# Patient Record
Sex: Female | Born: 1965 | Race: Black or African American | Hispanic: No | Marital: Single | Smoking: Current every day smoker
Health system: Southern US, Community
[De-identification: ages and names within clinical notes are randomized; demographics above are authoritative.]

## PROBLEM LIST (undated history)

## (undated) DIAGNOSIS — D649 Anemia, unspecified: Secondary | ICD-10-CM

## (undated) DIAGNOSIS — D219 Benign neoplasm of connective and other soft tissue, unspecified: Secondary | ICD-10-CM

## (undated) DIAGNOSIS — M1990 Unspecified osteoarthritis, unspecified site: Secondary | ICD-10-CM

## (undated) DIAGNOSIS — M810 Age-related osteoporosis without current pathological fracture: Secondary | ICD-10-CM

## (undated) HISTORY — DX: Anemia, unspecified: D64.9

## (undated) HISTORY — DX: Age-related osteoporosis without current pathological fracture: M81.0

## (undated) HISTORY — DX: Unspecified osteoarthritis, unspecified site: M19.90

---

## 1991-01-15 HISTORY — PX: EXCISIONAL HEMORRHOIDECTOMY: SHX1541

## 2010-04-15 HISTORY — PX: HYSTEROSCOPY: SHX211

## 2011-04-05 ENCOUNTER — Emergency Department (HOSPITAL_COMMUNITY)
Admission: EM | Admit: 2011-04-05 | Discharge: 2011-04-05 | Disposition: A | Discharge status: Home / Self Care | Payer: Self-pay | Attending: Emergency Medicine | Admitting: Emergency Medicine

## 2011-04-05 ENCOUNTER — Encounter (HOSPITAL_COMMUNITY): Payer: Self-pay | Admitting: *Deleted

## 2011-04-05 DIAGNOSIS — N939 Abnormal uterine and vaginal bleeding, unspecified: Secondary | ICD-10-CM

## 2011-04-05 DIAGNOSIS — N898 Other specified noninflammatory disorders of vagina: Secondary | ICD-10-CM | POA: Insufficient documentation

## 2011-04-05 DIAGNOSIS — N390 Urinary tract infection, site not specified: Secondary | ICD-10-CM | POA: Insufficient documentation

## 2011-04-05 DIAGNOSIS — F17200 Nicotine dependence, unspecified, uncomplicated: Secondary | ICD-10-CM | POA: Insufficient documentation

## 2011-04-05 HISTORY — DX: Benign neoplasm of connective and other soft tissue, unspecified: D21.9

## 2011-04-05 LAB — URINALYSIS, ROUTINE W REFLEX MICROSCOPIC
Bilirubin Urine: NEGATIVE
Glucose, UA: NEGATIVE mg/dL
Nitrite: NEGATIVE
Protein, ur: 30 mg/dL — AB
Specific Gravity, Urine: 1.021 (ref 1.005–1.030)
Urobilinogen, UA: 0.2 mg/dL (ref 0.0–1.0)
pH: 5.5 (ref 5.0–8.0)

## 2011-04-05 LAB — WET PREP, GENITAL
Clue Cells Wet Prep HPF POC: NONE SEEN
Trich, Wet Prep: NONE SEEN
WBC, Wet Prep HPF POC: NONE SEEN
Yeast Wet Prep HPF POC: NONE SEEN

## 2011-04-05 LAB — PREGNANCY, URINE: Preg Test, Ur: NEGATIVE

## 2011-04-05 LAB — POCT I-STAT, CHEM 8
Calcium, Ion: 1.26 mmol/L (ref 1.12–1.32)
Creatinine, Ser: 1 mg/dL (ref 0.50–1.10)
Glucose, Bld: 88 mg/dL (ref 70–99)
Hemoglobin: 11.6 g/dL — ABNORMAL LOW (ref 12.0–15.0)
Sodium: 144 mEq/L (ref 135–145)
TCO2: 24 mmol/L (ref 0–100)

## 2011-04-05 LAB — URINE MICROSCOPIC-ADD ON

## 2011-04-05 MED ORDER — NITROFURANTOIN MONOHYD MACRO 100 MG PO CAPS: 100.0000 mg | ORAL_CAPSULE | Freq: Two times a day (BID) | ORAL | Status: AC

## 2011-04-05 NOTE — ED Notes (Signed)
Pt states that she started her menstrual period x 2 weeks ago.  Pt engaged in coitus during the last few days of said menstrual cycle which then paused, then started back again heavier.

## 2011-04-05 NOTE — Discharge Instructions (Signed)
Follow up with the GYN. You also have a urinary tract infection and this could be causing your bleeding to continue with your fibroids. Return here as needed.

## 2011-04-05 NOTE — ED Provider Notes (Signed)
History     CSN: [id number]  Arrival date & time 04/05/11  0343   First MD Initiated Contact with Patient 04/05/11 [phone]      Chief Complaint  Patient presents with  . Vaginal Bleeding    (Consider location/radiation/quality/duration/timing/severity/associated sxs/prior treatment) HPI Christine Cabrera is a 46 yo female with a history of fibroids who presents to the ED with complaints of return of her menstrual cycle after coitus.  Pt states that 2 weeks ago Sunday she started her menstrual cycle, like normal, then Friday and Sunday she engaged in sexual activity.  Her period then stopped for a day and then started back on Tuesday and has been heavy since.  She states she has been passing clots, but denies any vaginal discharge, N/V/D, fever, chills, fatigue, weight changes.  Pt states that she has not established a PCP in the area yet, so she came in to get evaluated.  Pt states that her periods have been regular, though recently they have "been coming whenever they want."  Denies any history of STIs and only takes medications for heartburn.  Past Medical History  Diagnosis Date  . Fibroids     History reviewed. No pertinent past surgical history.  No family history on file.  History  Substance Use Topics  . Smoking status: Current Everyday Smoker -- 0.5 packs/day for 10 years    Types: Cigarettes  . Smokeless tobacco: Not on file  . Alcohol Use: No    OB History    Grav Para Term Preterm Abortions TAB SAB Ect Mult Living                  Review of Systems All pertinent positives and negatives reviewed in the history of present illness  Allergies  Ivp dye  Home Medications   Current Outpatient Rx  Name Route Sig Dispense Refill  . ESOMEPRAZOLE MAGNESIUM 10 MG PO PACK Oral Take 10 mg by mouth daily before breakfast.      BP 109/72  Pulse 81  Temp(Src) 98.4 F (36.9 C) (Oral)  Resp 20  Wt 197 lb (89.359 kg)  SpO2 100%  Physical Exam  Constitutional: She is  oriented to person, place, and time. She appears well-developed and well-nourished. No distress.  Cardiovascular: Normal rate, regular rhythm, normal heart sounds and intact distal pulses.   No murmur heard. Pulmonary/Chest: Effort normal and breath sounds normal. No respiratory distress. She has no wheezes.  Abdominal: Soft. Bowel sounds are normal. She exhibits no distension. There is no tenderness.  Genitourinary: No labial fusion. There is no rash, lesion or injury on the right labia. There is no rash, lesion or injury on the left labia. Cervix exhibits no motion tenderness, no discharge and no friability. There is bleeding around the vagina. No erythema or tenderness around the vagina. No signs of injury around the vagina. No vaginal discharge found.       Slight bleeding from cervical os and in vaginal canal; no clots visible.  Neurological: She is alert and oriented to person, place, and time.  Skin: Skin is warm. No rash noted. She is not diaphoretic. No erythema.    ED Course  Procedures (including critical care time)  Labs Reviewed  POCT I-STAT, CHEM 8 - Abnormal; Notable for the following:    BUN 5 (*)    Hemoglobin 11.6 (*)    HCT 34.0 (*)    All other components within normal limits  URINALYSIS, ROUTINE W REFLEX MICROSCOPIC   No  results found.  Pt seen and assessed.  Will order lab work and perform Pelvic exam. 8:17 AM Pelvic exam performed; no acute abnormolitieses seen.  Will run STI tests.  Encouraged to follow up with ob-gyn, health dept, or women's hospital clinics for further evaluation and management of fibroids.  Pt is comfortable with follow-up plan and knowing lab work is normal and she is not pregnant.  Patient will be referred to GYN from the emergency department.  She still can return here as needed for any worsening in her condition MDM  MDM Reviewed: nursing note, vitals and previous chart Interpretation: labs            Carlyle Dolly,  PA-C 04/05/11 1321

## 2011-04-05 NOTE — ED Notes (Addendum)
Pt states she is passing large clots with heavier bleeding than normal. Pt c/o abd cramping which is normal for her during her period. Pain improves with Motrin.

## 2011-04-06 LAB — GC/CHLAMYDIA PROBE AMP, GENITAL
Chlamydia, DNA Probe: NEGATIVE
GC Probe Amp, Genital: NEGATIVE

## 2011-04-08 NOTE — ED Provider Notes (Signed)
Medical screening examination/treatment/procedure(s) were performed by non-physician practitioner and as supervising physician I was immediately available for consultation/collaboration.   Danna Sewell E Kirk Sampley, MD 04/08/11 0659 

## 2011-04-18 ENCOUNTER — Encounter: Payer: Self-pay | Admitting: Obstetrics and Gynecology

## 2011-04-24 ENCOUNTER — Ambulatory Visit: Payer: Self-pay | Admitting: Obstetrics and Gynecology

## 2011-05-17 ENCOUNTER — Encounter: Payer: Self-pay | Admitting: Obstetrics and Gynecology

## 2011-06-14 ENCOUNTER — Emergency Department (HOSPITAL_COMMUNITY)
Admission: EM | Admit: 2011-06-14 | Discharge: 2011-06-15 | Disposition: A | Discharge status: Home / Self Care | Payer: Self-pay | Attending: Emergency Medicine | Admitting: Emergency Medicine

## 2011-06-14 ENCOUNTER — Encounter (HOSPITAL_COMMUNITY): Payer: Self-pay | Admitting: Emergency Medicine

## 2011-06-14 DIAGNOSIS — K649 Unspecified hemorrhoids: Secondary | ICD-10-CM

## 2011-06-14 DIAGNOSIS — K644 Residual hemorrhoidal skin tags: Secondary | ICD-10-CM | POA: Insufficient documentation

## 2011-06-14 DIAGNOSIS — K602 Anal fissure, unspecified: Secondary | ICD-10-CM | POA: Insufficient documentation

## 2011-06-14 DIAGNOSIS — F17200 Nicotine dependence, unspecified, uncomplicated: Secondary | ICD-10-CM | POA: Insufficient documentation

## 2011-06-14 NOTE — ED Notes (Signed)
PT. REPORTS RECTAL PAIN  /  HEMORRHOIDS FOR SEVERAL MONTHS , DENIES BLEEDING , NO FEVER OR CHILLS.

## 2011-06-15 MED ORDER — PRAMOX-PE-GLYCERIN-PETROLATUM 1-0.25-14.4-15 % RE CREA: TOPICAL_CREAM | RECTAL | Status: DC

## 2011-06-15 MED ORDER — TUCKS 50 % EX PADS: MEDICATED_PAD | CUTANEOUS | Status: DC

## 2011-06-15 NOTE — ED Provider Notes (Signed)
History     CSN: [id number]  Arrival date & time 06/14/11  2337   First MD Initiated Contact with Patient 06/15/11 0048      Chief Complaint  Patient presents with  . Hemorrhoids    (Consider location/radiation/quality/duration/timing/severity/associated sxs/prior treatment) The history is provided by the patient.   patient presents to emergency department complaining of a 2 month history of pain and itching around her anus. Patient states that 2 months ago she was very constipated and had multiple hard difficult bowel movement and since then has had itching and irritation around her anus. Patient states that she recently recently moved to [facility] and therefore "has no money for extra medicine" and therefore did not take over-the-counter medication for hemorrhoids or discomfort. Patient states pain "feels just like hemorrhoids that I had in the past but did not have the money for the medicine." Patient states that she does now have funds to buy medication however "wanted to make sure nothing else could possibly be going on." She denies fevers, chills, abdominal pain, nausea, vomiting, diarrhea, pelvic pain, pain with defecation. Patient states that her stools the loose and does not have difficulty with bowel movement. She denies blood in stool. Pain is aggravated by prolonged sitting and touch.  Past Medical History  Diagnosis Date  . Fibroids     History reviewed. No pertinent past surgical history.  No family history on file.  History  Substance Use Topics  . Smoking status: Current Everyday Smoker -- 0.5 packs/day for 10 years    Types: Cigarettes  . Smokeless tobacco: Not on file  . Alcohol Use: No    OB History    Grav Para Term Preterm Abortions TAB SAB Ect Mult Living                  Review of Systems  All other systems reviewed and are negative.    Allergies  Ivp dye  Home Medications   Current Outpatient Rx  Name Route Sig Dispense Refill  .  CYANOCOBALAMIN 1000 MCG PO TABS Oral Take 1,000 mcg by mouth daily as needed. Only around menstrual cycle.    Marland Kitchen ESOMEPRAZOLE MAGNESIUM 20 MG PO CPDR Oral Take 20 mg by mouth daily as needed. For upset stomach    . FERROUS SULFATE 325 (65 FE) MG PO TABS Oral Take 325 mg by mouth daily as needed. Only around menstrual cycle    . IBUPROFEN 800 MG PO TABS Oral Take 800 mg by mouth every 8 (eight) hours as needed. For arthritis pain    . PRAMOX-PE-GLYCERIN-PETROLATUM 1-0.25-14.4-15 % RE CREA  Apply per box directions to anus BID 5.7 g 0  . TUCKS 50 % EX PADS  Apply one pad to anus as directed by box instructions. 40 each 0    BP 118/77  Pulse 95  Temp(Src) 98 F (36.7 C) (Oral)  Resp 18  SpO2 99%  LMP 05/12/2011  Physical Exam  Nursing note and vitals reviewed. Constitutional: She is oriented to person, place, and time. She appears well-developed and well-nourished. No distress.  HENT:  Head: Normocephalic and atraumatic.  Eyes: Conjunctivae are normal.  Neck: Normal range of motion. Neck supple.  Cardiovascular: Normal rate, regular rhythm, normal heart sounds and intact distal pulses.  Exam reveals no gallop and no friction rub.   No murmur heard. Pulmonary/Chest: Effort normal and breath sounds normal. No respiratory distress. She has no wheezes. She has no rales. She exhibits no tenderness.  Abdominal: Soft.  Bowel sounds are normal. She exhibits no distension and no mass. There is no tenderness. There is no rebound and no guarding.  Genitourinary:       Small nonthrombosed hemorrhoid of anus with 2 mm superficial anal fissure with irritation around the anus but no induration or fluctuance.  Musculoskeletal: Normal range of motion. She exhibits no edema and no tenderness.  Neurological: She is alert and oriented to person, place, and time.  Skin: Skin is warm and dry. No rash noted. She is not diaphoretic. No erythema.  Psychiatric: She has a normal mood and affect.    ED Course    Procedures (including critical care time)  Labs Reviewed - No data to display No results found.   1. Hemorrhoids       MDM  Hemorrhoid and small anal fissure but no thrombosed hemorrhoid. Abdomen soft nontender. Denies any difficulty had a bowel movement. Denies pain with defecation. Patient agreeable to conservative treatment pain associated with hemorrhoid and following up with her primary care provider but returning to emergency department for changing or worsening symptoms.        [place], [place] 06/15/11 [phone]

## 2011-06-15 NOTE — Discharge Instructions (Signed)
Apply tuck's pads and preparation H cream as directed and follow up with your primary care provider in 1 week for recheck. Return to ER for emergent changing or worsening of symptoms.

## 2011-06-17 NOTE — ED Provider Notes (Signed)
Medical screening examination/treatment/procedure(s) were performed by non-physician practitioner and as supervising physician I was immediately available for consultation/collaboration.   Purcell Jungbluth, MD 06/17/11 1802 

## 2012-05-19 ENCOUNTER — Ambulatory Visit: Payer: Self-pay | Admitting: Family Medicine

## 2012-06-04 ENCOUNTER — Ambulatory Visit: Payer: Self-pay | Admitting: Family Medicine

## 2012-06-23 ENCOUNTER — Ambulatory Visit: Payer: Self-pay | Admitting: Family Medicine

## 2012-07-09 ENCOUNTER — Ambulatory Visit: Payer: Self-pay | Admitting: Family Medicine

## 2012-10-23 ENCOUNTER — Encounter: Payer: Self-pay | Admitting: Family Medicine

## 2012-10-23 ENCOUNTER — Ambulatory Visit (INDEPENDENT_AMBULATORY_CARE_PROVIDER_SITE_OTHER): Payer: Medicaid Other | Admitting: Family Medicine

## 2012-10-23 VITALS — BP 126/85 | HR 82 | Ht 61.5 in | Wt 199.4 lb

## 2012-10-23 DIAGNOSIS — M1710 Unilateral primary osteoarthritis, unspecified knee: Secondary | ICD-10-CM

## 2012-10-23 DIAGNOSIS — M170 Bilateral primary osteoarthritis of knee: Secondary | ICD-10-CM | POA: Insufficient documentation

## 2012-10-23 DIAGNOSIS — D259 Leiomyoma of uterus, unspecified: Secondary | ICD-10-CM

## 2012-10-23 DIAGNOSIS — D509 Iron deficiency anemia, unspecified: Secondary | ICD-10-CM

## 2012-10-23 DIAGNOSIS — D219 Benign neoplasm of connective and other soft tissue, unspecified: Secondary | ICD-10-CM

## 2012-10-23 DIAGNOSIS — Z716 Tobacco abuse counseling: Secondary | ICD-10-CM | POA: Insufficient documentation

## 2012-10-23 DIAGNOSIS — R635 Abnormal weight gain: Secondary | ICD-10-CM

## 2012-10-23 DIAGNOSIS — IMO0002 Reserved for concepts with insufficient information to code with codable children: Secondary | ICD-10-CM

## 2012-10-23 DIAGNOSIS — E669 Obesity, unspecified: Secondary | ICD-10-CM | POA: Insufficient documentation

## 2012-10-23 DIAGNOSIS — K219 Gastro-esophageal reflux disease without esophagitis: Secondary | ICD-10-CM

## 2012-10-23 DIAGNOSIS — Z7189 Other specified counseling: Secondary | ICD-10-CM

## 2012-10-23 DIAGNOSIS — F17200 Nicotine dependence, unspecified, uncomplicated: Secondary | ICD-10-CM

## 2012-10-23 NOTE — Assessment & Plan Note (Signed)
Pt currently smoking about 1/3 pack per day, interested in quitting but declines specific assistance from a medical perspective. Is interested in health coach counseling, and has heard of but has not called 1-800-QUIT-NOW. Encouraged cessation and use of QUIT-NOW number, and will follow up; will also route note to health coach.

## 2012-10-23 NOTE — Assessment & Plan Note (Signed)
Per pt history, has had hysteroscopy in the past. Would like to establish care with an OBGYN; referral to [facility] outpt clinic placed today. Some irregular/heavy bleeding possibly due to menopause, but could also reflect simple fibroids. Will check TSH with fasting labs. See also iron deficiency problem list note.

## 2012-10-23 NOTE — Assessment & Plan Note (Signed)
Uncertain etiology, though likely related to irregular / heavy periods. Will check iron panel and CBC as well as TSH and evaluate kidney function with fasting lab draw. Otherwise continue iron supplement, Rx refilled today.

## 2012-10-23 NOTE — Assessment & Plan Note (Addendum)
Pt's primary concern at this visit. Discussed general diet and exercise counseling, as well as the importance of setting specific, measurable goals (e.g., if she drinks 7 Pepsi's in one week, cutting back to 5, etc). Discussed three-day food diary. Referred to Dr. Gerilyn Pilgrim, and will continue to discuss lifestyle changes at regular follow-up. Will also check fasting lipid panel and CMP to assess liver function and serum glucose.

## 2012-10-23 NOTE — Assessment & Plan Note (Signed)
Well-controlled with Nexium. Refilled Rx today. Has taken multiple PPI's and other medications in the past with poor tolerance / control of symptoms.

## 2012-10-23 NOTE — Progress Notes (Signed)
  Subjective:    Patient ID: Christine Cabrera, female    DOB: 11-26-1965, 47 y.o.   MRN: [id number]  HPI: Pt presents to clinic to establish care. Pt last seen by a doctor almost two years ago. Previously had a PCP and OBGYN in [place] but now is living in [place].  Fibroids / heavy periods / "menopause": reports irregular periods for the past few months -has had hysteroscopy in the past showing fibroids, but has never had surgery -was seen by an OBGYN regularly in [place] and would like referral to establish with one locally -states the last few months, her periods have been 7-9 days in length and occasionally come more than once per month -is not currently on any form birth control and is not currently sexually active  Obesity: BMI 37, pt very interested in losing weight and has noted increased weight gain over the last year -attributes some chronic joint pain to weight (as below) -has cut back on smoking and thinks she is eating more as a result -admits to "drinking a lot of regular Pepsi," at least 1 per day -has not seen a nutritionist in the past, but expresses interest in seeing Dr. Gerilyn Pilgrim  GERD - taking Nexium, without specific current complaints -has taken several other PPI's in the past without good tolerance / relief of symptoms  Osteoarthritis: mostly in knees bilaterally, also with left shoulder pain ?related to possible rotator cuff injury in the past -takes ibuprofen as needed, but generally "isn't bothered too badly" by pain at least currently  Anemia / iron deficiency: chronic anemia, possibly related to heavy periods as above -pt has been on iron 'for a long time,' and reports a strong family history of anemia  SH: Works as a Lawyer in [place], also potentially going to [employer] for further education classes. Pt is a current smoker, interested in quitting; smokes about 1/3 pack per day. Declines patches/medications, but is interested in counseling with  health coach.  In addition to the above documentation, pt's PMH, surgical history, FH, and SH all reviewed and updated where appropriate in the EMR. Of note, pt has a family history of DM and is interested in screening. I have also reviewed and updated the pt's allergies and current medications as appropriate.   Review of Systems: As above. Additionally, pt complains of occasional joint pain/swelling, and occasional anxiety related to "family issues."  Otherwise, full 12-system ROS was reviewed and all negative.     Objective:   Physical Exam BP 126/85  Pulse 82  Ht 5' 1.5" (1.562 m)  Wt 199 lb 6.4 oz (90.447 kg)  BMI 37.07 kg/m2  LMP 10/08/2012 Gen: well-appearing adult in NAD HEENT: [place]/AT, sclerae/conjunctivae clear, no lid lag, EOMI, PERRLA   MMM, posterior oropharynx clear, no cervical lymphadenopathy  neck supple with full ROM, no masses appreciated; thyroid not enlarged  Cardio: RRR, no murmur appreciated; distal pulses intact/symmetric Pulm: CTAB, no wheezes, normal WOB  Abd: soft, nondistended, BS+, no HSM Ext: warm/well-perfused, no cyanosis/clubbing/edema MSK: strength 5/5 in all four extremities, no frank joint deformity/effusion  normal ROM to all four extremities with no point muscle/bony tenderness in spine  Crepitus and pain with passive ROM in knees bilaterally and in left shoulder, but ROM not reduced Neuro/Psych: alert/oriented, sensation grossly intact; normal gait/balance  mood euthymic with congruent affect     Assessment & Plan:

## 2012-10-23 NOTE — Patient Instructions (Signed)
Thank you for coming in, today!  I will refill your Nexium, iron, and ibuprofen. I put in a referral for you for OBGYN. Come back at your convenience FASTING for a lab draw. I will call you or send you a letter with those results. After we get your labs, I will plan to see you back. That will probably need to be in about 1 month or so. I will give you Dr. Johnell Comings card (our dietician). She is in our office here. Before you see her, keep a food diary of EVERYTHING you eat and drink for three days. I will let our health coach know that you are interested in stopping smoking. You can also call 1-800-QUIT-NOW, the free [place] smoking cessation hotline.  Please feel free to call with any questions or concerns at any time, at [phone]. --Dr. Casper Harrison  General exercise habits:  -Start where you are, and work to where you want to be   --Don't set the bar so high you get discouraged and quit   --Give yourself time to make gradual changes that you can manage  -Move when you can! (Walk, stand, stretch, etc during the day)  -Be consistent (don't put off "exercise time" when you have time that you can use)  -Set aside specific "exercise time," 30-60 minutes at least 3 days a week   --Use this time for aerobic and/or weight-based exercise   --Warm up and stretching is important   --"Aerobic" means brisk walk/jog, running, biking, treadmill, etc   --Balance between aerobic and weight training (not just one or the other)  -Set specific, reasonable, measurable goals   --Example: "I'm going to start jogging"    --First week, 30 minutes, three different days: walking pace    --Second week, 30 minutes, three different days: half walk, half jog    --Third week, 30 minutes, three different days: full jog  -Have "rainy day" plans/goals: "If I can't run outside today, I'll use the treadmill."  General good eating habits:  -Start where you are, and work to where you want to be   --Don't set the bar so high you  get discouraged and quit   --Give yourself time to make gradual changes that you can manage  -Eat three meals at regular times, consistently (don't skip meals)  -Limit snacks between meals, and make healthy choices (like fruits/vegetables)  -Pay attention to portion sizes (too much food is too much, even "healthy" foods)  -In general, for a lunch or dinner plate:   --Half should be vegetables   --Split the other half between meat/protein and carbs  Food type examples:  Proteins: egg, meat, fish, beans  Fats: fried foods, oils/butter/margerine, cheese, dressings  Carbohydrates: bread, crackers, rice, pasta, potatoes, sugar-sweetened drinks

## 2012-10-23 NOTE — Assessment & Plan Note (Signed)
Long-standing history, likely related to obesity. Also with strong family history. Continue ibuprofen PRN. Consider xrays / further work-up if pain worsens or if other issues arise.

## 2012-10-28 ENCOUNTER — Other Ambulatory Visit: Payer: Medicaid Other

## 2012-10-28 DIAGNOSIS — R635 Abnormal weight gain: Secondary | ICD-10-CM

## 2012-10-28 DIAGNOSIS — D509 Iron deficiency anemia, unspecified: Secondary | ICD-10-CM

## 2012-10-28 DIAGNOSIS — E669 Obesity, unspecified: Secondary | ICD-10-CM

## 2012-10-28 LAB — TSH: TSH: 1.786 u[IU]/mL (ref 0.350–4.500)

## 2012-10-28 LAB — CBC WITH DIFFERENTIAL/PLATELET
Basophils Relative: 1 % (ref 0–1)
HCT: 36.5 % (ref 36.0–46.0)
Hemoglobin: 12.4 g/dL (ref 12.0–15.0)
Lymphs Abs: 2.1 10*3/uL (ref 0.7–4.0)
MCH: 28.6 pg (ref 26.0–34.0)
MCHC: 34 g/dL (ref 30.0–36.0)
Monocytes Absolute: 0.4 10*3/uL (ref 0.1–1.0)
Monocytes Relative: 9 % (ref 3–12)
Neutro Abs: 1.7 10*3/uL (ref 1.7–7.7)

## 2012-10-28 LAB — COMPREHENSIVE METABOLIC PANEL
AST: 15 U/L (ref 0–37)
Alkaline Phosphatase: 80 U/L (ref 39–117)
BUN: 8 mg/dL (ref 6–23)
Calcium: 9.6 mg/dL (ref 8.4–10.5)
Creat: 1.05 mg/dL (ref 0.50–1.10)

## 2012-10-28 LAB — IRON AND TIBC
%SAT: 15 % — ABNORMAL LOW (ref 20–55)
UIBC: 303 ug/dL (ref 125–400)

## 2012-10-28 LAB — LIPID PANEL: HDL: 39 mg/dL — ABNORMAL LOW (ref >39)

## 2012-10-28 LAB — FERRITIN: Ferritin: 10 ng/mL (ref 10–291)

## 2012-10-28 NOTE — Progress Notes (Signed)
CBC,CMP,FLP,TSH,FERRITIN,TIBC AND TRANSFERRIN DONE TODAY Christine Cabrera

## 2012-10-29 ENCOUNTER — Encounter: Payer: Self-pay | Admitting: Family Medicine

## 2012-10-29 LAB — TRANSFERRIN: Transferrin: 270 mg/dL (ref 200–360)

## 2012-10-30 ENCOUNTER — Telehealth: Payer: Self-pay | Admitting: Family Medicine

## 2012-10-30 NOTE — Telephone Encounter (Signed)
I'm not sure. It was given to Volo or maybe Molly Maduro after her visit the other day. I'm not sure where those forms go, after that. --CMS

## 2012-10-30 NOTE — Telephone Encounter (Signed)
Pt called to give Dr. Casper Harrison her previous doctors correct address: Dr. Hosie Spangle [address] [place] [address] [phone]. JW

## 2012-10-30 NOTE — Telephone Encounter (Signed)
Where is the form?

## 2012-10-30 NOTE — Telephone Encounter (Signed)
Thanks! This address / phone number should be applied to the release of information form that the pt signed at her establish-care visit earlier this week. --CMS

## 2012-11-02 ENCOUNTER — Ambulatory Visit (INDEPENDENT_AMBULATORY_CARE_PROVIDER_SITE_OTHER): Payer: Medicaid Other | Admitting: Medical

## 2012-11-02 ENCOUNTER — Encounter: Payer: Self-pay | Admitting: Medical

## 2012-11-02 ENCOUNTER — Telehealth: Payer: Self-pay | Admitting: Family Medicine

## 2012-11-02 VITALS — BP 126/73 | HR 105 | Temp 97.9°F | Ht 60.5 in | Wt 202.8 lb

## 2012-11-02 DIAGNOSIS — D259 Leiomyoma of uterus, unspecified: Secondary | ICD-10-CM

## 2012-11-02 DIAGNOSIS — D219 Benign neoplasm of connective and other soft tissue, unspecified: Secondary | ICD-10-CM

## 2012-11-02 DIAGNOSIS — N949 Unspecified condition associated with female genital organs and menstrual cycle: Secondary | ICD-10-CM

## 2012-11-02 DIAGNOSIS — N898 Other specified noninflammatory disorders of vagina: Secondary | ICD-10-CM

## 2012-11-02 DIAGNOSIS — Z01419 Encounter for gynecological examination (general) (routine) without abnormal findings: Secondary | ICD-10-CM

## 2012-11-02 NOTE — Patient Instructions (Signed)
Fibroids Fibroids are lumps (tumors) that can occur any place in a woman's body. These lumps are not cancerous. Fibroids vary in size, weight, and where they grow. HOME CARE  Do not take aspirin.  Write down the number of pads or tampons you use during your period. Tell your doctor. This can help determine the best treatment for you. GET HELP RIGHT AWAY IF:  You have pain in your lower belly (abdomen) that is not helped with medicine.  You have cramps that are not helped with medicine.  You have more bleeding between or during your period.  You feel lightheaded or pass out (faint).  Your lower belly pain gets worse. MAKE SURE YOU:  Understand these instructions.  Will watch your condition.  Will get help right away if you are not doing well or get worse. Document Released: 02/02/2010 Document Revised: 03/25/2011 Document Reviewed: 02/02/2010 Exit[employer] Patient Information 2014 [employer].  

## 2012-11-02 NOTE — Progress Notes (Signed)
Patient ID: Christine Cabrera, female   DOB: 02/26/1965, 47 y.o.   MRN: [id number]  History:  Christine Cabrera is a 47 y.o. [phone] who presents to clinic today to establish care with a GYN. The patient has a history of fibroids diagnosed in [place], [place] in April 2012. She states that she has regular periods that are heavy x 2 days and then normal. On heavier days she will have to change her pad q 3 hours. Last mammogram was in 2012 and was normal. Last pap smear was in 2012 and normal. She denies history of abnormal pap smears.   The following portions of the patient's history were reviewed and updated as appropriate: allergies, current medications, past family history, past medical history, past social history, past surgical history and problem list.  Review of Systems:  Pertinent items are noted in HPI.  Objective:  Physical Exam BP 126/73  Pulse 105  Temp(Src) 97.9 F (36.6 C)  Ht 5' 0.5" (1.537 m)  Wt 202 lb 12.8 oz (91.989 kg)  BMI 38.94 kg/m2  LMP 10/08/2012 GENERAL: Well-developed, well-nourished female in no acute distress.  HEENT: Normocephalic, atraumatic.  LUNGS: Normal effort HEART: Regular rate ABDOMEN: Soft, nontender, nondistended. PELVIC: Normal external female genitalia. Vagina is pink and rugated.  Normal discharge. Normal cervix contour. Wet prep obtained. Uterus is slightly enlarged in size. Exam is limited by body habitus. No adnexal mass or tenderness.  EXTREMITIES: No cyanosis, clubbing, or edema.   Assessment & Plan:  Assessment: Fibroids Vaginal odor  Plans: 1. Patient will be contacted with any abnormal results of the wet prep 2. Patient scheduled for annual screening mammogram 3. Patient to return to [facility] clinic 2015 for annual exam with pap smear 4. Patient may return to [facility] clinic PRN otherwise  Freddi Starr, PA-C 11/02/2012 1:47 PM

## 2012-11-02 NOTE — Telephone Encounter (Signed)
Pt called and needs refills on ibuprofen 800, ferrous sulfate, and nexium 40 mg. She needs these sent to the [facility] on [address] and [address]. JW

## 2012-11-02 NOTE — Progress Notes (Signed)
Here today because has fibroids. Also reports usually has normal periods, until September had a period that lasted 2 days, stopped for 4 days then started again for usual 6-7 days.

## 2012-11-02 NOTE — Telephone Encounter (Signed)
Forward to PCP for refills.Busick, Robert Lee  

## 2012-11-03 LAB — WET PREP, GENITAL
Trich, Wet Prep: NONE SEEN
Yeast Wet Prep HPF POC: NONE SEEN

## 2012-11-03 MED ORDER — IBUPROFEN 800 MG PO TABS: 800.0000 mg | ORAL_TABLET | Freq: Three times a day (TID) | ORAL | Status: DC | PRN

## 2012-11-03 MED ORDER — ESOMEPRAZOLE MAGNESIUM 20 MG PO CPDR: 20.0000 mg | DELAYED_RELEASE_CAPSULE | Freq: Every day | ORAL | Status: DC | PRN

## 2012-11-03 MED ORDER — FERROUS SULFATE 325 (65 FE) MG PO TABS: 325.0000 mg | ORAL_TABLET | Freq: Every day | ORAL | Status: AC | PRN

## 2012-11-03 NOTE — Telephone Encounter (Signed)
Rx's sent in. Thanks. --CMS

## 2012-11-05 ENCOUNTER — Other Ambulatory Visit: Payer: Self-pay | Admitting: Medical

## 2012-11-05 DIAGNOSIS — B9689 Other specified bacterial agents as the cause of diseases classified elsewhere: Secondary | ICD-10-CM

## 2012-11-05 MED ORDER — METRONIDAZOLE 500 MG PO TABS: 500.0000 mg | ORAL_TABLET | Freq: Two times a day (BID) | ORAL | Status: AC

## 2012-11-05 NOTE — Progress Notes (Signed)
Rx sent to pharmacy   

## 2012-11-06 ENCOUNTER — Telehealth: Payer: Self-pay | Admitting: General Practice

## 2012-11-06 ENCOUNTER — Telehealth: Payer: Self-pay

## 2012-11-06 MED ORDER — ESOMEPRAZOLE MAGNESIUM 20 MG PO CPDR: 40.0000 mg | DELAYED_RELEASE_CAPSULE | Freq: Every day | ORAL | Status: AC | PRN

## 2012-11-06 NOTE — Telephone Encounter (Signed)
Message copied by Kathee Delton on Fri Nov 06, 2012 11:24 AM ------      Message from: Freddi Starr      Created: Thu Nov 05, 2012  1:21 PM       Please call patient and inform her that she has BV and that she has Rx for Flagyl at her pharmacy. Thanks!            Raynelle Fanning ------

## 2012-11-06 NOTE — Telephone Encounter (Signed)
Will fwd to MD for dosage clarification.  Lydon Vansickle, Darlyne Russian, CMA

## 2012-11-06 NOTE — Telephone Encounter (Signed)
Patient states that Nexium RX was supposed to be for 40 mg, not 20 mg.

## 2012-11-06 NOTE — Telephone Encounter (Signed)
Sent in corrected dose as a new Rx. Thanks. --CMS

## 2012-11-06 NOTE — Telephone Encounter (Signed)
Called patient and informed her of results and medication available for pickup. Patient verbalized understanding and had no further questions  

## 2012-11-09 ENCOUNTER — Telehealth: Payer: Self-pay | Admitting: *Deleted

## 2012-11-09 ENCOUNTER — Telehealth: Payer: Self-pay | Admitting: Family Medicine

## 2012-11-09 NOTE — Telephone Encounter (Signed)
Completed prior auth for Nexium 20 mg tablets, sig: 40 mg daily PRN for upset stomach. Authorization criteria - trial and failure of Prilosec and Protonix in the past. Dispense approved for 1 year, #60 per 30 days. Confirmation number [id number] P. --CMS

## 2012-11-09 NOTE — Telephone Encounter (Addendum)
Pt left message stating that she did not understand the name of the infection that she had. Please call back with this information. I returned pt's call and left message that I have her information. Please call back and indicate whether we may leave detailed information on her voice mail.

## 2012-11-11 NOTE — Telephone Encounter (Signed)
Pt. Called and left message at 10:22 this morning stating that it was OK to leave detailed information on her voicemail. She also stated that her new number is [phone] and that she no longer has the phone number [phone]. Pt. Stated she would just like to know the name of the infection she has.

## 2012-11-11 NOTE — Telephone Encounter (Signed)
Called pt. And informed her that the name of her infection is bacterial vaginosis. Informed her that it is not sexually transmitted, it is simply an imbalance of good and bad bacteria in her vagina. Pt. Stated she felt much better knowing the name and that she had no further questions.

## 2012-11-16 ENCOUNTER — Ambulatory Visit: Payer: Medicaid Other | Admitting: Family Medicine

## 2012-11-19 ENCOUNTER — Other Ambulatory Visit: Payer: Self-pay | Admitting: Medical

## 2012-11-19 ENCOUNTER — Ambulatory Visit (HOSPITAL_COMMUNITY)
Admission: RE | Admit: 2012-11-19 | Discharge: 2012-11-19 | Disposition: A | Discharge status: Home / Self Care | Payer: Medicaid Other | Source: Ambulatory Visit | Attending: Medical | Admitting: Medical

## 2012-11-19 DIAGNOSIS — Z01419 Encounter for gynecological examination (general) (routine) without abnormal findings: Secondary | ICD-10-CM

## 2012-11-19 DIAGNOSIS — Z1231 Encounter for screening mammogram for malignant neoplasm of breast: Secondary | ICD-10-CM

## 2012-11-24 ENCOUNTER — Encounter: Payer: Self-pay | Admitting: Family Medicine

## 2012-11-25 ENCOUNTER — Telehealth: Payer: Self-pay | Admitting: Family Medicine

## 2012-11-25 NOTE — Telephone Encounter (Signed)
Pt called and wanted to know if Dr. Casper Harrison had received her medical records from her doctor in [place]?

## 2012-11-26 NOTE — Telephone Encounter (Signed)
I did receive her records and will review them today and discuss with her at her appointment tomorrow. Please call patient to let her know. Thanks! --CMS

## 2012-11-26 NOTE — Telephone Encounter (Signed)
Will fwd. To Dr.Street for review. .Christine Cabrera  

## 2012-11-26 NOTE — Telephone Encounter (Signed)
Called pt. No answer. No voice mail. Please see message, if pt calls back. Thanks .Arlyss Repress

## 2012-11-27 ENCOUNTER — Ambulatory Visit: Payer: Medicaid Other | Admitting: Family Medicine

## 2012-12-07 ENCOUNTER — Ambulatory Visit: Payer: Medicaid Other | Admitting: Family Medicine

## 2012-12-09 ENCOUNTER — Ambulatory Visit: Payer: Medicaid Other | Admitting: Family Medicine

## 2012-12-23 ENCOUNTER — Ambulatory Visit: Payer: Medicaid Other | Admitting: Family Medicine

## 2012-12-30 ENCOUNTER — Ambulatory Visit: Payer: Medicaid Other | Admitting: Family Medicine

## 2013-01-13 ENCOUNTER — Ambulatory Visit: Payer: Medicaid Other | Admitting: Family Medicine

## 2013-02-04 ENCOUNTER — Ambulatory Visit: Payer: Medicaid Other | Admitting: Family Medicine

## 2013-02-11 ENCOUNTER — Ambulatory Visit (HOSPITAL_COMMUNITY)
Admission: RE | Admit: 2013-02-11 | Discharge: 2013-02-11 | Disposition: A | Discharge status: Home / Self Care | Payer: Medicaid Other | Source: Ambulatory Visit | Attending: Family Medicine | Admitting: Family Medicine

## 2013-02-11 ENCOUNTER — Encounter: Payer: Self-pay | Admitting: Family Medicine

## 2013-02-11 ENCOUNTER — Ambulatory Visit (INDEPENDENT_AMBULATORY_CARE_PROVIDER_SITE_OTHER): Payer: Medicaid Other | Admitting: Family Medicine

## 2013-02-11 VITALS — BP 125/82 | HR 80 | Ht 60.5 in | Wt 196.0 lb

## 2013-02-11 DIAGNOSIS — M949 Disorder of cartilage, unspecified: Secondary | ICD-10-CM

## 2013-02-11 DIAGNOSIS — M79609 Pain in unspecified limb: Secondary | ICD-10-CM

## 2013-02-11 DIAGNOSIS — M899 Disorder of bone, unspecified: Secondary | ICD-10-CM | POA: Insufficient documentation

## 2013-02-11 DIAGNOSIS — M79671 Pain in right foot: Secondary | ICD-10-CM

## 2013-02-11 NOTE — Assessment & Plan Note (Addendum)
Most likely she has a right foot sprain With her story of hearing the cracking sound and bony tenderness will proceed with plain x-ray of the right foot Explained treatment for sprain with rest, ice, compression, and elevation. Explained that compression is not so necessary as this causes her more pain. Schedule NSAIDs for 5 days If she is a fracture would consider osteoporosis or osteopenia check vitamin D and calcium levels at followup. Has had several steroid injections due to old tendon tear and tendinitis, previously saw podiatry in [place]. I would consider a podiatry or sports medicine referral to continue these therapies if it becomes necessary after resolution of this acute episode.

## 2013-02-11 NOTE — Progress Notes (Signed)
Patient ID: Christine Cabrera, female   DOB: February 05, 1965, 48 y.o.   MRN: [id number]  Kenn File, MD Phone: [phone]  Subjective:  Chief complaint-noted  SDA for R foot pain.   Patient states that about one week ago she was sitting on her right foot when she shifted her weight and move her foot she heard a crack and had immediate pain. Her pain at first was not so severe but is worsened throughout the week.  she has not tried any medication or any therapies for at all. She reports mid dorsal foot pain worse with compression that radiates with a burning sensation down to all her toes.   She reports a history of Achilles tendinitis and a ligament tear which he stopped it I dressed in [place] for. She states that this pain is very distinct and different in that pain. She's previously been treated with several steroid injections in her foot.   She has been bearing weight all week on her foot. She states that it feels better after walking some, but begins to hurt worse after a long time of walking. It's also worse after prolonged rest.   ROS-  no fever, chills, sweats Some baseline dyspnea No chest pain No vomiting No dominant pain   Some development of right calf and right thigh pain several days after the onset of her pain.  Past Medical History Patient Active Problem List   Diagnosis Date Noted  . Right foot pain 02/11/2013  . Iron deficiency anemia 10/23/2012  . Fibroids 10/23/2012  . GERD (gastroesophageal reflux disease) 10/23/2012  . Obesity, unspecified 10/23/2012  . Tobacco abuse counseling 10/23/2012  . Osteoarthritis of both knees 10/23/2012    Medications- reviewed and updated Current Outpatient Prescriptions  Medication Sig Dispense Refill  . cyanocobalamin 1000 MCG tablet Take 1,000 mcg by mouth daily as needed. Only around menstrual cycle.      . esomeprazole (NEXIUM) 20 MG capsule Take 2 capsules (40 mg total) by mouth daily as needed. For upset stomach   30 capsule  3  . ferrous sulfate 325 (65 FE) MG tablet Take 1 tablet (325 mg total) by mouth daily as needed. Only around menstrual cycle  30 tablet  3  . ibuprofen (ADVIL,MOTRIN) 800 MG tablet Take 1 tablet (800 mg total) by mouth every 8 (eight) hours as needed. For arthritis pain  50 tablet  3  . metroNIDAZOLE (FLAGYL) 500 MG tablet Take 1 tablet (500 mg total) by mouth 2 (two) times daily.  14 tablet  0   No current facility-administered medications for this visit.    Objective: BP 125/82  Pulse 80  Ht 5' 0.5" (1.537 m)  Wt 196 lb (88.905 kg)  BMI 37.63 kg/m2  LMP 02/11/2012 Gen: NAD, alert, cooperative with exam HEENT: NCAT, EOMI, PERRL CV: RRR, good S1/S2, no murmur Resp: CTABL, no wheezes, non-labored Ext: R calf soft and with mild tenderness to palpation, no palpable cords, R foot with tenderness across the distal eands of all 5 metatarsals, Neuro: Alert and oriented, strength 5/5 in affected foot and sensation intact to monofilament on all 5 toes.    Assessment/Plan:  Right foot pain Most likely she has a right foot sprain With her story of hearing the cracking sound and bony tenderness will proceed with plain x-ray of the right foot Explained treatment for sprain with rest, ice, compression, and elevation. Explained that compression is not so necessary as this causes her more pain. If she is a  fracture would consider osteoporosis or osteopenia check vitamin D and calcium levels at followup. Has had several steroid injections due to old tendon tear and tendinitis, previously saw podiatry in [place]. I would consider a podiatry or sports medicine referral to continue these therapies if it becomes necessary after resolution of this acute episode.    Orders Placed This Encounter  Procedures  . DG Foot Complete Right    Standing Status: Future     Number of Occurrences:      Standing Expiration Date: 04/12/2014    Order Specific Question:  Reason for Exam (SYMPTOM   OR DIAGNOSIS REQUIRED)    Answer:  pain, eval for fracture    Order Specific Question:  Is the patient pregnant?    Answer:  No    Order Specific Question:  Preferred imaging location?    Answer:  [facility]    No orders of the defined types were placed in this encounter.

## 2013-02-11 NOTE — Patient Instructions (Signed)
It was great to meet you!  I think you have a foot sprain sprain but will take an x-ray just to make sure that there is no fracture. Treatment for sprains is rest, ice, compression, and elevation.  I recommend ice for 15 minutes every 2 hours as needed. Schedule 800 mg of ibuprofen every 8 hours for the next 5 days.

## 2013-02-13 ENCOUNTER — Telehealth: Payer: Self-pay | Admitting: Family Medicine

## 2013-02-13 NOTE — Telephone Encounter (Signed)
Called to inform her that her foot xray was negative for fracture. Foot pain is improving with ice and NSAIDs. She continues to have pain at the site of her old steroid injections.   Told her she may need sports med vs podiatry referral for further eval of her foot that has needed several injections through the years as these are not typical joints that I inject.   She agrees, Will call back if pain not improved.  Laroy Apple, MD [place] Resident, PGY-2 02/13/2013, 10:08 AM

## 2013-02-16 ENCOUNTER — Ambulatory Visit: Payer: Medicaid Other | Admitting: Family Medicine

## 2013-09-14 ENCOUNTER — Telehealth: Payer: Self-pay | Admitting: Family Medicine

## 2013-09-14 NOTE — Telephone Encounter (Signed)
Pt called because she faxed forms for her new job on 8/31 to be filled out by Dr. Venetia Maxon and faxed back to the phone number on the forms. She was checking that status and also she knows that her PPD was done about 2 years ago and the new employer is requiring her to have one every year. The problem is that she doesn't have insurance and also doesn't have the 50.00 that they charge in [place] for this  And wondered if Dr. Venetia Maxon could just say she is current on the forms? Please call her about any questions at [phone]. Blima Rich

## 2013-09-14 NOTE — Telephone Encounter (Signed)
I completed the forms yesterday (8/31), but marked that a PPD was not done (because I was not aware if any that had been done). I WILL NOT mark that one has been done in the last year because to my knowledge one has not. If her employer requires one yearly, she will need to have one done if she requires a form with a "done on" or "up to date" marked. Please let her know this. I cannot (and will not) mark she is up to date if there is not proof she is up to date from another source, or unless we do a TB test at our clinic. --CMS  Note routed FYI to [place] and [employer].

## 2013-09-15 NOTE — Telephone Encounter (Signed)
Patient informed of message from PCP, expressed understanding.

## 2013-11-15 ENCOUNTER — Encounter: Payer: Self-pay | Admitting: Family Medicine

## 2014-11-05 IMAGING — CR DG FOOT COMPLETE 3+V*R*
3 series · 3 of 3 positions shown · non-contrast
Comparison: None.

CLINICAL DATA: Right foot pain

EXAM:
RIGHT FOOT COMPLETE - 3+ VIEW

[x foot ap right]
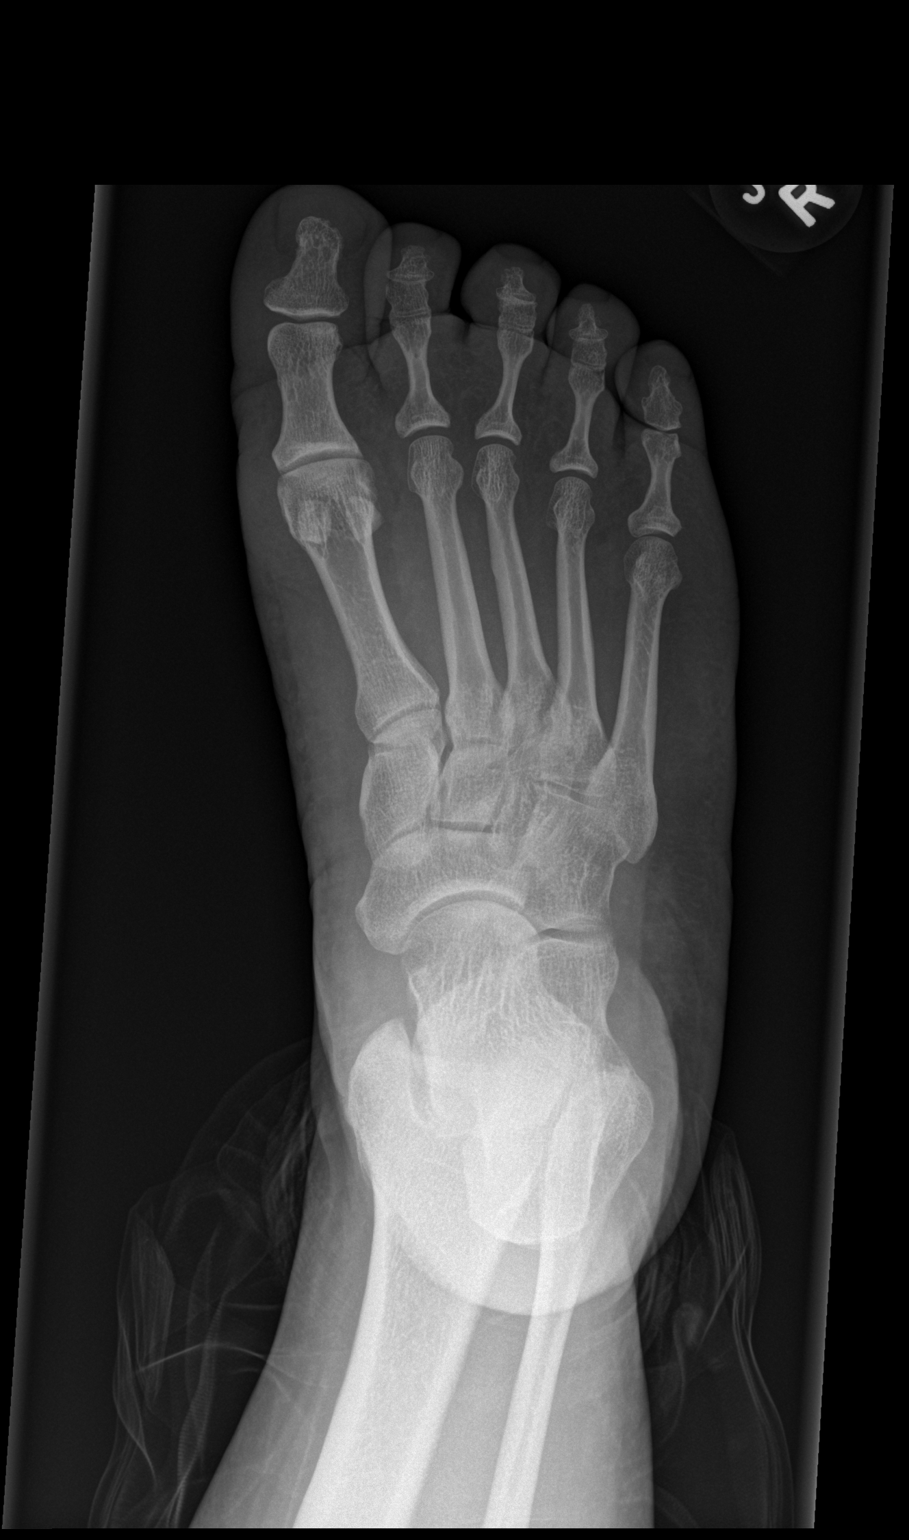

[x foot obl right]
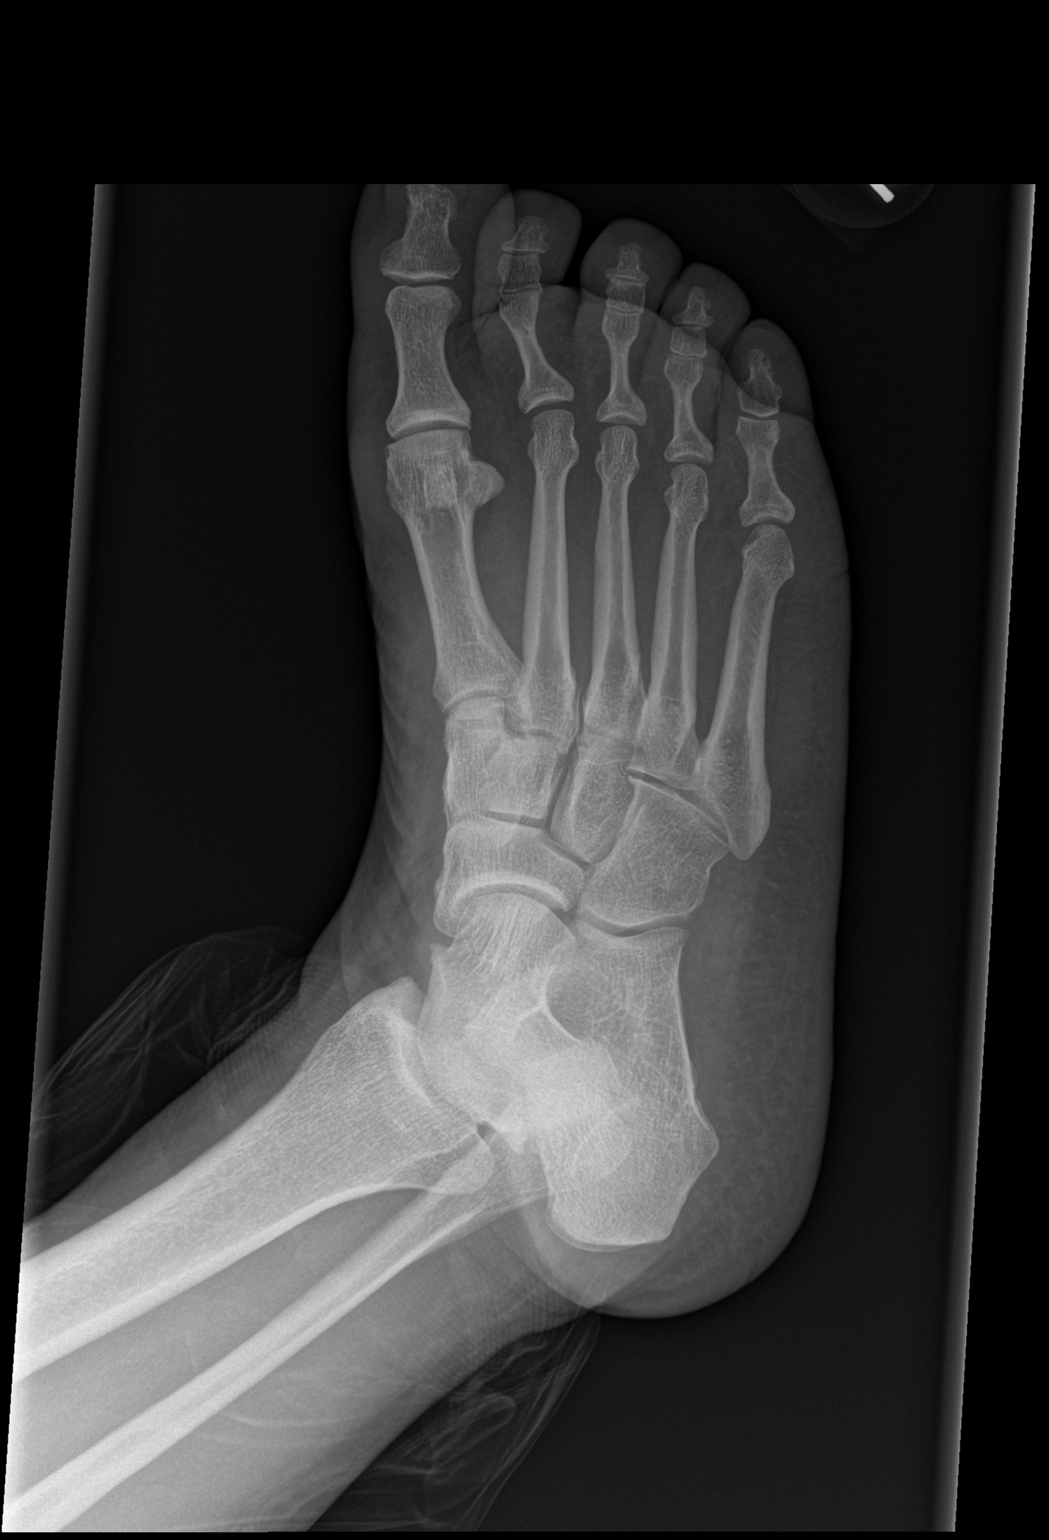

[x foot lat right]
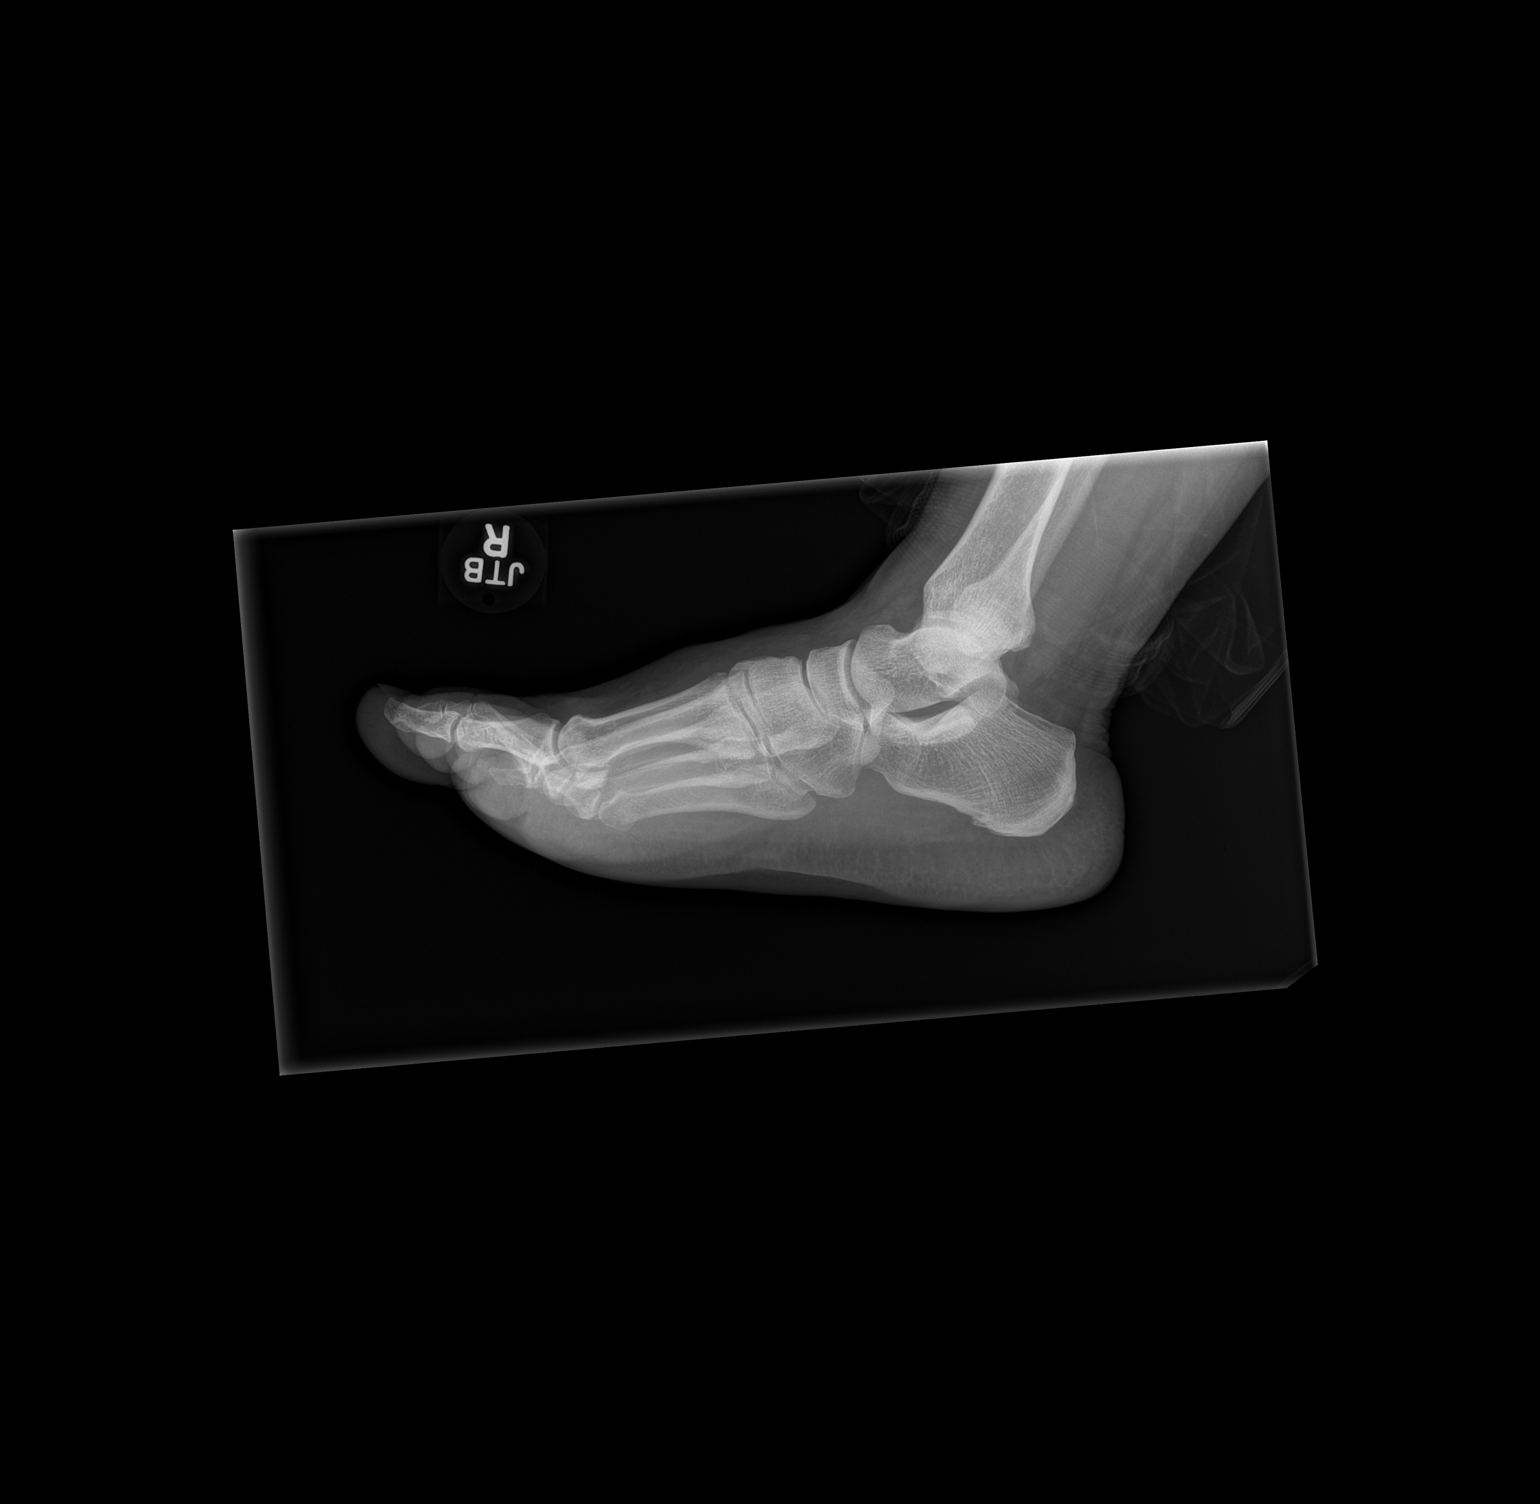

[3 of 3 positions shown; findings below may reference images not displayed]

FINDINGS: Mild degenerative change in the first MTP. No other degenerative
change. No fracture or erosion.
IMPRESSION: Mild degenerative change first MTP

## 2016-06-26 ENCOUNTER — Encounter (HOSPITAL_COMMUNITY): Payer: Self-pay | Admitting: *Deleted

## 2016-06-26 ENCOUNTER — Emergency Department (HOSPITAL_COMMUNITY): Payer: Self-pay

## 2016-06-26 ENCOUNTER — Emergency Department (HOSPITAL_COMMUNITY)
Admission: EM | Admit: 2016-06-26 | Discharge: 2016-06-26 | Disposition: A | Discharge status: Home / Self Care | Payer: Self-pay | Attending: Emergency Medicine | Admitting: Emergency Medicine

## 2016-06-26 DIAGNOSIS — Y999 Unspecified external cause status: Secondary | ICD-10-CM | POA: Insufficient documentation

## 2016-06-26 DIAGNOSIS — Y9301 Activity, walking, marching and hiking: Secondary | ICD-10-CM | POA: Insufficient documentation

## 2016-06-26 DIAGNOSIS — F17210 Nicotine dependence, cigarettes, uncomplicated: Secondary | ICD-10-CM | POA: Insufficient documentation

## 2016-06-26 DIAGNOSIS — W0110XA Fall on same level from slipping, tripping and stumbling with subsequent striking against unspecified object, initial encounter: Secondary | ICD-10-CM | POA: Insufficient documentation

## 2016-06-26 DIAGNOSIS — Z79899 Other long term (current) drug therapy: Secondary | ICD-10-CM | POA: Insufficient documentation

## 2016-06-26 DIAGNOSIS — M25532 Pain in left wrist: Secondary | ICD-10-CM | POA: Insufficient documentation

## 2016-06-26 DIAGNOSIS — Y929 Unspecified place or not applicable: Secondary | ICD-10-CM | POA: Insufficient documentation

## 2016-06-26 DIAGNOSIS — W19XXXA Unspecified fall, initial encounter: Secondary | ICD-10-CM

## 2016-06-26 DIAGNOSIS — M79672 Pain in left foot: Secondary | ICD-10-CM | POA: Insufficient documentation

## 2016-06-26 MED ORDER — IBUPROFEN 600 MG PO TABS: 600.0000 mg | ORAL_TABLET | Freq: Four times a day (QID) | ORAL | 0 refills | Status: AC | PRN

## 2016-06-26 NOTE — ED Provider Notes (Signed)
[facility] DEPT Provider Note   CSN: [id number] Arrival date & time: 06/26/16  3810  By signing my name below, I, Collene Leyden, attest that this documentation has been prepared under the direction and in the presence of Waynetta Pean, PA-C. Electronically Signed: Collene Leyden, Scribe. 06/26/16. 10:56 AM.  History   Chief Complaint Chief Complaint  Patient presents with  . Fall    HPI Comments: Christine Cabrera is a 51 y.o. female with a history of arthritis in the bilateral knees, who presents to the Emergency Department complaining of left foot pain s/p mechanical fall that occurred yesterday morning. Patient states she was running trying to catch the bus in [place] when she tripped and fell. She also reports pain to her left hand. Patient denies any numbness or weakness. She denies hitting her head or LOC.  Patient reports associated left hand pain. No medications taken prior to arrival. Patient is ambulatory in the emergency department. Patient denies any fever, chills, loss of consciousness, or head injury.   The history is provided by the patient. No language interpreter was used.    Past Medical History:  Diagnosis Date  . Anemia    Runs in family, takes iron  . Arthritis    Osteoarthritis in knees  . Fibroids   . Osteoporosis     Patient Active Problem List   Diagnosis Date Noted  . Right foot pain 02/11/2013  . Iron deficiency anemia 10/23/2012  . Fibroids 10/23/2012  . GERD (gastroesophageal reflux disease) 10/23/2012  . Obesity, unspecified 10/23/2012  . Tobacco abuse counseling 10/23/2012  . Osteoarthritis of both knees 10/23/2012    Past Surgical History:  Procedure Laterality Date  . EXCISIONAL HEMORRHOIDECTOMY  1993  . HYSTEROSCOPY  April 2012    OB History    Gravida Para Term Preterm AB Living   4 4 4     4    SAB TAB Ectopic Multiple Live Births                   Home Medications    Prior to Admission medications   Medication Sig Start  Date End Date Taking? Authorizing Provider  cyanocobalamin 1000 MCG tablet Take 1,000 mcg by mouth daily as needed. Only around menstrual cycle.    [provider]  esomeprazole (NEXIUM) 20 MG capsule Take 2 capsules (40 mg total) by mouth daily as needed. For upset stomach 11/06/12   Street, Sharon Mt, MD  ferrous sulfate 325 (65 FE) MG tablet Take 1 tablet (325 mg total) by mouth daily as needed. Only around menstrual cycle 11/03/12   Street, Sharon Mt, MD  ibuprofen (ADVIL,MOTRIN) 600 MG tablet Take 1 tablet (600 mg total) by mouth every 6 (six) hours as needed for mild pain or moderate pain. 06/26/16   Waynetta Pean, PA-C  metroNIDAZOLE (FLAGYL) 500 MG tablet Take 1 tablet (500 mg total) by mouth 2 (two) times daily. 11/05/12   Luvenia Redden, PA-C    Family History Family History  Problem Relation Age of Onset  . Diabetes Mother   . Cancer Mother        Breast  . Heart disease Mother        Stents  . Cancer Father        Prostate  . Arthritis Father        Rheumatoid  . Hypertension Father   . Stroke Sister     Social History Social History  Substance Use Topics  . Smoking status: Current  Every Day Smoker    Packs/day: 0.25    Years: 10.00    Types: Cigarettes  . Smokeless tobacco: Never Used     Comment: 1pack 3 days  . Alcohol use No     Allergies   Ivp dye [iodinated diagnostic agents]   Review of Systems Review of Systems  Constitutional: Negative for chills and fever.  Musculoskeletal: Positive for arthralgias (left hand, left knee). Negative for gait problem and joint swelling.  Skin: Negative for rash and wound.  Neurological: Negative for syncope, weakness and numbness.     Physical Exam Updated Vital Signs BP (!) 114/92 (BP Location: Right Arm)   Pulse 86   Temp 98.4 F (36.9 C) (Oral)   Resp 16   Ht 5\' 1"  (1.549 m)   Wt 92.5 kg (204 lb)   LMP 04/26/2016 Comment: perimenopausal  SpO2 100%   BMI 38.55 kg/m   Physical Exam   Constitutional: She appears well-developed and well-nourished. No distress.  Nontoxic appearing.  HENT:  Head: Normocephalic and atraumatic.  Eyes: Right eye exhibits no discharge. Left eye exhibits no discharge.  Cardiovascular: Normal rate, regular rhythm and intact distal pulses.   Dorsalis pedis and posterior tibialis pulses intact.   Pulmonary/Chest: Effort normal. No respiratory distress.  Musculoskeletal: Normal range of motion. She exhibits tenderness. She exhibits no edema or deformity.  Tenderness to the medial aspect of the left foot. Achilles tendon is intact. No left foot deformity. No ankle instability. Full range of motion of the ankle. Good capillary refill.  Tenderness to the medial aspect of the left hand near the 5th digit.  Full range of motion of the left wrist and of her hands. No snuff box tenderness to palpation. No open wounds or abrasions noted.  Patient's bilateral shoulder, elbow, hip, knee and ankle joints are supple and without tenderness or edema.  Neurological: She is alert. No sensory deficit. Coordination normal.  Normal gait.   Skin: Skin is warm and dry. Capillary refill takes less than 2 seconds. No rash noted. She is not diaphoretic. No erythema. No pallor.  Psychiatric: She has a normal mood and affect. Her behavior is normal.  Nursing note and vitals reviewed.    ED Treatments / Results  DIAGNOSTIC STUDIES: Oxygen Saturation is 100% on RA, normal by my interpretation.    COORDINATION OF CARE: 10:52 AM Discussed treatment plan with pt at bedside and pt agreed to plan, which includes imaging.  Labs (all labs ordered are listed, but only abnormal results are displayed) Labs Reviewed - No data to display  EKG  EKG Interpretation None       Radiology Dg Hand Complete Left  Result Date: 06/26/2016 CLINICAL DATA:  Pain over first metacarpal bones status post fall. EXAM: LEFT HAND - COMPLETE 3+ VIEW COMPARISON:  None. FINDINGS: There is no  evidence of displaced fracture or dislocation. Well corticated osseous fragments seen overlying the first carpometacarpal joint. No significant arthropathy. Soft tissues are normal. IMPRESSION: No definite evidence of displaced fracture or dislocation. Well corticated osseous fragment seen overlying the first carpometacarpal joint. This may represent an osteophyte or age-indeterminate avulsion fracture fragment. Electronically Signed   By: Fidela Salisbury M.D.   On: 06/26/2016 12:00   Dg Foot Complete Left  Result Date: 06/26/2016 CLINICAL DATA:  Status post fall.  Pain. EXAM: LEFT FOOT - COMPLETE 3+ VIEW COMPARISON:  None. FINDINGS: No acute fracture dislocation. Mild osteoarthritis of the first MTP joint. Small plantar calcaneal spur. No aggressive  osseous lesion. IMPRESSION: 1.  No acute osseous injury of the left foot. Electronically Signed   By: Kathreen Devoid   On: 06/26/2016 11:58    Procedures Procedures (including critical care time)  Medications Ordered in ED Medications - No data to display   Initial Impression / Assessment and Plan / ED Course  I have reviewed the triage vital signs and the nursing notes.  Pertinent labs & imaging results that were available during my care of the patient were reviewed by me and considered in my medical decision making (see chart for details).     This  is a 51 y.o. female with a history of arthritis in the bilateral knees, who presents to the Emergency Department complaining of left foot pain s/p mechanical fall that occurred yesterday morning. Patient states she was running trying to catch the bus in [place] when she tripped and fell. She also reports pain to her left hand. Patient denies any numbness or weakness. She denies hitting her head or LOC. On exam patient has some mild tenderness over the medial aspect of her left hand near her fifth digit. No hand deformity, ecchymosis or open wounds. She also has some tenderness overlying the medial  aspect of her left foot. No deformity, ecchymosis or edema. Other joints are without tenderness or edema. X-rays were obtained of her left hand and left foot. A left hand x-ray shows no definite evidence of displaced fracture or dislocation. I did show a fragment overlying the first carpometacarpal joint. This may represent an osteophyte or an age-indeterminate avulsion fracture fragment. Patient is not having much tenderness in this area. Despite this, patient would like Velcro wrist splint. Patient also requests crutches and Ace bandage. Will provide this for her today. If her pain persists she can follow up with sports medicine and/or primary care. Ibuprofen and ice for pain control. Return precautions discussed. I advised the patient to follow-up with their primary care provider this week. I advised the patient to return to the emergency department with new or worsening symptoms or new concerns. The patient verbalized understanding and agreement with plan.      Final Clinical Impressions(s) / ED Diagnoses   Final diagnoses:  Fall, initial encounter  Left foot pain  Left wrist pain    New Prescriptions New Prescriptions   IBUPROFEN (ADVIL,MOTRIN) 600 MG TABLET    Take 1 tablet (600 mg total) by mouth every 6 (six) hours as needed for mild pain or moderate pain.   I personally performed the services described in this documentation, which was scribed in my presence. The recorded information has been reviewed and is accurate.      Waynetta Pean, PA-C 06/26/16 1227    Davonna Belling, MD 06/26/16 [phone]

## 2016-06-26 NOTE — Progress Notes (Signed)
Orthopedic Tech Progress Note Patient Details:  Christine Cabrera Oct 25, 1965 [id number]  Ortho Devices Type of Ortho Device: Ace wrap, Crutches, Velcro wrist splint Ortho Device/Splint Location: lue/lle Ortho Device/Splint Interventions: Application   Jetson Pickrel 06/26/2016, 12:38 PM

## 2016-06-26 NOTE — ED Triage Notes (Signed)
Pt was running to the [employer] bus and fell.  Now c/o bil knee, heel and hand pain.
